# Patient Record
Sex: Female | Born: 1960 | Race: Black or African American | Hispanic: No | Marital: Single
Health system: Midwestern US, Community
[De-identification: ages and names within clinical notes are randomized; demographics above are authoritative.]

## PROBLEM LIST (undated history)

## (undated) DIAGNOSIS — J441 Chronic obstructive pulmonary disease with (acute) exacerbation: Principal | ICD-10-CM

## (undated) DIAGNOSIS — F319 Bipolar disorder, unspecified: Secondary | ICD-10-CM

## (undated) DIAGNOSIS — J45909 Unspecified asthma, uncomplicated: Secondary | ICD-10-CM

## (undated) DIAGNOSIS — I509 Heart failure, unspecified: Secondary | ICD-10-CM

## (undated) DIAGNOSIS — B20 Human immunodeficiency virus [HIV] disease: Secondary | ICD-10-CM

## (undated) DIAGNOSIS — G629 Polyneuropathy, unspecified: Secondary | ICD-10-CM

## (undated) DIAGNOSIS — J449 Chronic obstructive pulmonary disease, unspecified: Secondary | ICD-10-CM

## (undated) DIAGNOSIS — B029 Zoster without complications: Secondary | ICD-10-CM

## (undated) DIAGNOSIS — F209 Schizophrenia, unspecified: Secondary | ICD-10-CM

## (undated) HISTORY — PX: BRONCHOSCOPY: SUR163

## (undated) HISTORY — PX: ABDOMINAL HYSTERECTOMY: SHX81

## (undated) HISTORY — PX: BUNIONECTOMY: SHX129

---

## 2015-05-14 ENCOUNTER — Inpatient Hospital Stay
Admit: 2015-05-14 | Discharge: 2015-05-14 | Disposition: A | Discharge status: Home / Self Care | Payer: MEDICARE | Attending: Pediatric Emergency Medicine

## 2015-05-14 DIAGNOSIS — J4521 Mild intermittent asthma with (acute) exacerbation: Secondary | ICD-10-CM

## 2015-05-14 MED ORDER — ALBUTEROL SULFATE HFA 90 MCG/ACTUATION AEROSOL INHALER
90 mcg/actuation | RESPIRATORY_TRACT | Status: AC
Start: 2015-05-14 — End: 2015-05-14
  Administered 2015-05-14: via RESPIRATORY_TRACT

## 2015-05-14 MED ORDER — ALBUTEROL SULFATE 0.083 % (0.83 MG/ML) SOLN FOR INHALATION
2.5 mg /3 mL (0.083 %) | RESPIRATORY_TRACT | Status: AC
Start: 2015-05-14 — End: 2015-05-14
  Administered 2015-05-14: 23:00:00 via RESPIRATORY_TRACT

## 2015-05-14 MED ORDER — DEXAMETHASONE SODIUM PHOSPHATE 10 MG/ML IJ SOLN
10 mg/mL | INTRAMUSCULAR | Status: AC
Start: 2015-05-14 — End: 2015-05-14
  Administered 2015-05-14: 21:00:00 via INTRAMUSCULAR

## 2015-05-14 MED ORDER — ALBUTEROL SULFATE 0.083 % (0.83 MG/ML) SOLN FOR INHALATION
2.5 mg /3 mL (0.083 %) | RESPIRATORY_TRACT | Status: AC
Start: 2015-05-14 — End: 2015-05-14
  Administered 2015-05-14: 21:00:00 via RESPIRATORY_TRACT

## 2015-05-14 MED ORDER — IPRATROPIUM BROMIDE 0.02 % SOLN FOR INHALATION
0.02 % | RESPIRATORY_TRACT | Status: AC
Start: 2015-05-14 — End: 2015-05-14
  Administered 2015-05-14: 21:00:00 via RESPIRATORY_TRACT

## 2015-05-14 MED ORDER — PREDNISONE 20 MG TAB
20 mg | ORAL_TABLET | Freq: Every day | ORAL | Status: AC
Start: 2015-05-14 — End: 2015-05-18

## 2015-05-14 MED FILL — VENTOLIN HFA 90 MCG/ACTUATION AEROSOL INHALER: 90 mcg/actuation | RESPIRATORY_TRACT | Qty: 8

## 2015-05-14 MED FILL — ALBUTEROL SULFATE 0.083 % (0.83 MG/ML) SOLN FOR INHALATION: 2.5 mg /3 mL (0.083 %) | RESPIRATORY_TRACT | Qty: 3

## 2015-05-14 MED FILL — DEXAMETHASONE SODIUM PHOSPHATE 10 MG/ML IJ SOLN: 10 mg/mL | INTRAMUSCULAR | Qty: 1

## 2015-05-14 MED FILL — IPRATROPIUM BROMIDE 0.02 % SOLN FOR INHALATION: 0.02 % | RESPIRATORY_TRACT | Qty: 2.5

## 2015-05-14 MED FILL — ALBUTEROL SULFATE 0.083 % (0.83 MG/ML) SOLN FOR INHALATION: 2.5 mg /3 mL (0.083 %) | RESPIRATORY_TRACT | Qty: 2

## 2015-05-14 NOTE — ED Notes (Signed)
6:11 PM   Pt signed out to me by Dr. Manson Passey awaiting reeval after neb and decadron.   Anticipate d/c.     6:42 PM  Pt still wheezing after neb x 1 and decadron.   Will provide another round of neb treatment and reeval.     7:38 PM  Pt reevaluated after 2nd neb.   Lungs CTA. Pt ready to go home.     6:23 PM  Documented by Alwyn Pea, acting as a scribe for Dr. Elesa Hacker, MD.     PROVIDER ATTESTATION:  7:39 PM    The entirety of this note, signed by me, accurately reflects all works, treatments, procedures, and medical decision making performed by me, Johnna Acosta, MD.

## 2015-05-14 NOTE — ED Notes (Signed)
Report to sheila . Pt admits to feeling much better. Neb tx completed. Food to pt.

## 2015-05-14 NOTE — ED Notes (Signed)
Pt discharged home with instruction in hand pt left ambulating steady and signed that she understood the instructions giving
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
## 2015-05-14 NOTE — ED Notes (Signed)
Breath sounds improving. Wheeze present rt side dr. Manson PasseyBrown made aware. Additional neb treatment to be ordered.
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
## 2016-05-04 ENCOUNTER — Inpatient Hospital Stay
Admit: 2016-05-04 | Discharge: 2016-05-04 | Disposition: A | Discharge status: Home / Self Care | Payer: MEDICARE | Attending: Emergency Medicine

## 2016-05-04 ENCOUNTER — Emergency Department: Admit: 2016-05-04 | Payer: MEDICARE | Primary: Family Medicine

## 2016-05-04 ENCOUNTER — Observation Stay

## 2016-05-04 DIAGNOSIS — J441 Chronic obstructive pulmonary disease with (acute) exacerbation: Secondary | ICD-10-CM

## 2016-05-04 LAB — EKG 12-LEAD
Atrial Rate: 93 {beats}/min
P Axis: 75 degrees
P-R Interval: 136 ms
Q-T Interval: 388 ms
QRS Duration: 78 ms
QTc Calculation (Bazett): 482 ms
R Axis: 23 degrees
T Axis: 63 degrees
Ventricular Rate: 93 {beats}/min

## 2016-05-04 LAB — METABOLIC PANEL, BASIC
Anion gap: 15 mmol/L (ref 10–17)
BUN/Creatinine ratio: 9 (ref 6.0–20.0)
BUN: 8 MG/DL (ref 7–18)
CO2: 26 mmol/L (ref 21–32)
Calcium: 8.9 MG/DL (ref 8.5–10.1)
Chloride: 105 mmol/L (ref 98–107)
Creatinine: 0.85 MG/DL (ref 0.6–1.3)
GFR est AA: >60 mL/min/{1.73_m2} (ref >60)
GFR est non-AA: >60 mL/min/{1.73_m2} (ref >60)
Glucose: 117 mg/dL — ABNORMAL HIGH (ref 74–106)
Potassium: 3.8 mmol/L (ref 3.50–5.10)
Sodium: 142 mmol/L (ref 136–145)

## 2016-05-04 LAB — CBC WITH AUTOMATED DIFF
ABS. BASOPHILS: 0 10*3/uL (ref 0.0–0.2)
ABS. EOSINOPHILS: 0.6 10*3/uL (ref 0.0–0.7)
ABS. LYMPHOCYTES: 2.6 10*3/uL (ref 1.2–3.4)
ABS. MONOCYTES: 0.6 10*3/uL — ABNORMAL LOW (ref 1.1–3.2)
ABS. NEUTROPHILS: 2.9 10*3/uL (ref 1.4–6.5)
BASOPHILS: 0 % (ref 0–2)
EOSINOPHILS: 9 % — ABNORMAL HIGH (ref 0–5)
HCT: 34.8 % (ref 34.0–42.2)
HGB: 10.9 g/dL — ABNORMAL LOW (ref 11.8–14.2)
IMMATURE GRANULOCYTES: 0.4 % (ref 0.0–5.0)
LYMPHOCYTES: 39 % (ref 16–40)
MCH: 24.3 PG — ABNORMAL LOW (ref 27–31)
MCHC: 31.3 g/dL — ABNORMAL LOW (ref 32–36)
MCV: 77.7 FL — ABNORMAL LOW (ref 81–99)
MONOCYTES: 8 % (ref 0–12)
MPV: 10 FL (ref 7.4–10.4)
NEUTROPHILS: 44 % (ref 40–70)
PLATELET: 243 10*3/uL (ref 140–450)
RBC: 4.48 M/uL (ref 4.0–5.2)
RDW: 13.6 % (ref 11.5–14.5)
WBC: 6.8 10*3/uL (ref 4.8–10.8)

## 2016-05-04 LAB — EKG, 12 LEAD, INITIAL
Atrial Rate: 93 {beats}/min
Calculated P Axis: 75 degrees
Calculated R Axis: 23 degrees
Calculated T Axis: 63 degrees
P-R Interval: 136 ms
Q-T Interval: 388 ms
QRS Duration: 78 ms
QTC Calculation (Bezet): 482 ms
Ventricular Rate: 93 {beats}/min

## 2016-05-04 LAB — BNP: BNP: 36 pg/mL (ref 5–100)

## 2016-05-04 LAB — TROPONIN I: Troponin-I, Qt.: <0.02 ng/mL (ref 0.00–0.08)

## 2016-05-04 MED ORDER — SENNOSIDES 8.6 MG TAB
8.6 mg | Freq: Every day | ORAL | Status: DC
Start: 2016-05-04 — End: 2016-05-05
  Administered 2016-05-05: 14:00:00 via ORAL

## 2016-05-04 MED ORDER — EMTRICITABINE-TENOFOVIR 200 MG-300 MG TAB
200-300 mg | Freq: Every day | ORAL | Status: DC
Start: 2016-05-04 — End: 2016-05-05
  Administered 2016-05-04 – 2016-05-05 (×2): via ORAL

## 2016-05-04 MED ORDER — PREGABALIN 50 MG CAP
50 mg | Freq: Two times a day (BID) | ORAL | Status: DC
Start: 2016-05-04 — End: 2016-05-05
  Administered 2016-05-04 – 2016-05-05 (×3): via ORAL

## 2016-05-04 MED ORDER — ALBUTEROL SULFATE 0.083 % (0.83 MG/ML) SOLN FOR INHALATION
2.5 mg /3 mL (0.083 %) | Freq: Two times a day (BID) | RESPIRATORY_TRACT | Status: DC
Start: 2016-05-04 — End: 2016-05-05
  Administered 2016-05-04 (×2): via RESPIRATORY_TRACT

## 2016-05-04 MED ORDER — ALBUTEROL SULFATE 0.083 % (0.83 MG/ML) SOLN FOR INHALATION
2.5 mg /3 mL (0.083 %) | RESPIRATORY_TRACT | Status: DC | PRN
Start: 2016-05-04 — End: 2016-05-05
  Administered 2016-05-04 – 2016-05-05 (×4): via RESPIRATORY_TRACT

## 2016-05-04 MED ORDER — TRAMADOL 50 MG TAB
50 mg | Freq: Three times a day (TID) | ORAL | Status: DC
Start: 2016-05-04 — End: 2016-05-05
  Administered 2016-05-04 – 2016-05-05 (×4): via ORAL

## 2016-05-04 MED ORDER — IPRATROPIUM BROMIDE 0.02 % SOLN FOR INHALATION
0.02 % | RESPIRATORY_TRACT | Status: AC
Start: 2016-05-04 — End: 2016-05-04
  Administered 2016-05-04: 07:00:00 via RESPIRATORY_TRACT

## 2016-05-04 MED ORDER — SODIUM CHLORIDE 0.9 % IV
INTRAVENOUS | Status: DC
Start: 2016-05-04 — End: 2016-05-04

## 2016-05-04 MED ORDER — MAGNESIUM SULFATE 2 GRAM/50 ML IVPB
2 gram/50 mL (4 %) | Freq: Once | INTRAVENOUS | Status: AC
Start: 2016-05-04 — End: 2016-05-04
  Administered 2016-05-04: 06:00:00 via INTRAVENOUS

## 2016-05-04 MED ORDER — PANTOPRAZOLE 40 MG TAB, DELAYED RELEASE
40 mg | Freq: Every day | ORAL | Status: DC
Start: 2016-05-04 — End: 2016-05-05
  Administered 2016-05-04 – 2016-05-05 (×2): via ORAL

## 2016-05-04 MED ORDER — IPRATROPIUM BROMIDE 0.02 % SOLN FOR INHALATION
0.02 % | RESPIRATORY_TRACT | Status: AC
Start: 2016-05-04 — End: 2016-05-04
  Administered 2016-05-04: 06:00:00 via RESPIRATORY_TRACT

## 2016-05-04 MED ORDER — IPRATROPIUM BROMIDE 0.02 % SOLN FOR INHALATION
0.02 % | RESPIRATORY_TRACT | Status: AC
Start: 2016-05-04 — End: 2016-05-04

## 2016-05-04 MED ORDER — PREDNISONE 20 MG TAB
20 mg | ORAL | Status: DC
Start: 2016-05-04 — End: 2016-05-04

## 2016-05-04 MED ORDER — OTHER(NON-FORMULARY)
Freq: Every day | Status: DC
Start: 2016-05-04 — End: 2016-05-04

## 2016-05-04 MED ORDER — AZITHROMYCIN 250 MG TAB
250 mg | Freq: Every day | ORAL | Status: DC
Start: 2016-05-04 — End: 2016-05-05
  Administered 2016-05-04 – 2016-05-05 (×2): via ORAL

## 2016-05-04 MED ORDER — SODIUM CHLORIDE 0.9 % IJ SYRG
INTRAMUSCULAR | Status: DC | PRN
Start: 2016-05-04 — End: 2016-05-04

## 2016-05-04 MED ORDER — GUAIFENESIN 100 MG/5 ML ORAL LIQUID
100 mg/5 mL | Freq: Four times a day (QID) | ORAL | Status: DC | PRN
Start: 2016-05-04 — End: 2016-05-05

## 2016-05-04 MED ORDER — ALBUTEROL SULFATE 0.083 % (0.83 MG/ML) SOLN FOR INHALATION
2.5 mg /3 mL (0.083 %) | RESPIRATORY_TRACT | Status: AC
Start: 2016-05-04 — End: 2016-05-04

## 2016-05-04 MED ORDER — LISINOPRIL 5 MG TAB
5 mg | Freq: Every day | ORAL | Status: DC
Start: 2016-05-04 — End: 2016-05-05
  Administered 2016-05-04 – 2016-05-05 (×2): via ORAL

## 2016-05-04 MED ORDER — NALOXONE 0.4 MG/ML INJECTION
0.4 mg/mL | INTRAMUSCULAR | Status: DC | PRN
Start: 2016-05-04 — End: 2016-05-05

## 2016-05-04 MED ORDER — ACETAMINOPHEN 325 MG TABLET
325 mg | Freq: Four times a day (QID) | ORAL | Status: DC | PRN
Start: 2016-05-04 — End: 2016-05-05

## 2016-05-04 MED ORDER — NICOTINE 7 MG/24 HR DAILY PATCH
7 mg/24 hr | TRANSDERMAL | Status: DC
Start: 2016-05-04 — End: 2016-05-05

## 2016-05-04 MED ORDER — ENOXAPARIN 40 MG/0.4 ML SUB-Q SYRINGE
40 mg/0.4 mL | SUBCUTANEOUS | Status: DC
Start: 2016-05-04 — End: 2016-05-05
  Administered 2016-05-04 – 2016-05-05 (×2): via SUBCUTANEOUS

## 2016-05-04 MED ORDER — ALBUTEROL SULFATE 0.083 % (0.83 MG/ML) SOLN FOR INHALATION
2.5 mg /3 mL (0.083 %) | RESPIRATORY_TRACT | Status: DC
Start: 2016-05-04 — End: 2016-05-04

## 2016-05-04 MED ORDER — SODIUM CHLORIDE 0.9 % IJ SYRG
INTRAMUSCULAR | Status: DC | PRN
Start: 2016-05-04 — End: 2016-05-05

## 2016-05-04 MED ORDER — METHYLPREDNISOLONE (PF) 40 MG/ML IJ SOLR
40 mg/mL | Freq: Four times a day (QID) | INTRAMUSCULAR | Status: DC
Start: 2016-05-04 — End: 2016-05-05
  Administered 2016-05-04 – 2016-05-05 (×6): via INTRAVENOUS

## 2016-05-04 MED ORDER — ALBUTEROL SULFATE 0.083 % (0.83 MG/ML) SOLN FOR INHALATION
2.5 mg /3 mL (0.083 %) | RESPIRATORY_TRACT | Status: AC
Start: 2016-05-04 — End: 2016-05-04
  Administered 2016-05-04: 07:00:00 via RESPIRATORY_TRACT

## 2016-05-04 MED ORDER — ALBUTEROL SULFATE 0.083 % (0.83 MG/ML) SOLN FOR INHALATION
2.5 mg /3 mL (0.083 %) | RESPIRATORY_TRACT | Status: AC
Start: 2016-05-04 — End: 2016-05-04
  Administered 2016-05-04: 06:00:00 via RESPIRATORY_TRACT

## 2016-05-04 MED ORDER — DIPHENHYDRAMINE 25 MG CAP
25 mg | Freq: Four times a day (QID) | ORAL | Status: DC | PRN
Start: 2016-05-04 — End: 2016-05-05

## 2016-05-04 MED ORDER — SODIUM CHLORIDE 0.9 % IJ SYRG
Freq: Three times a day (TID) | INTRAMUSCULAR | Status: DC
Start: 2016-05-04 — End: 2016-05-05
  Administered 2016-05-04 – 2016-05-05 (×5): via INTRAVENOUS

## 2016-05-04 MED ORDER — MONTELUKAST 10 MG TAB
10 mg | Freq: Every day | ORAL | Status: DC
Start: 2016-05-04 — End: 2016-05-05
  Administered 2016-05-04 – 2016-05-05 (×2): via ORAL

## 2016-05-04 MED ORDER — SODIUM CHLORIDE 0.9 % IJ SYRG
Freq: Three times a day (TID) | INTRAMUSCULAR | Status: DC
Start: 2016-05-04 — End: 2016-05-04

## 2016-05-04 MED ORDER — BUDESONIDE 0.25 MG/2 ML NEB SUSPENSION
0.25 mg/2 mL | Freq: Two times a day (BID) | RESPIRATORY_TRACT | Status: DC
Start: 2016-05-04 — End: 2016-05-05
  Administered 2016-05-04 – 2016-05-05 (×2): via RESPIRATORY_TRACT

## 2016-05-04 MED FILL — ALBUTEROL SULFATE 0.083 % (0.83 MG/ML) SOLN FOR INHALATION: 2.5 mg /3 mL (0.083 %) | RESPIRATORY_TRACT | Qty: 1

## 2016-05-04 MED FILL — MAGNESIUM SULFATE 2 GRAM/50 ML IVPB: 2 gram/50 mL (4 %) | INTRAVENOUS | Qty: 50

## 2016-05-04 MED FILL — SOLU-MEDROL (PF) 40 MG/ML SOLUTION FOR INJECTION: 40 mg/mL | INTRAMUSCULAR | Qty: 1

## 2016-05-04 MED FILL — MONTELUKAST 10 MG TAB: 10 mg | ORAL | Qty: 1

## 2016-05-04 MED FILL — PANTOPRAZOLE 40 MG TAB, DELAYED RELEASE: 40 mg | ORAL | Qty: 1

## 2016-05-04 MED FILL — ALBUTEROL SULFATE 0.083 % (0.83 MG/ML) SOLN FOR INHALATION: 2.5 mg /3 mL (0.083 %) | RESPIRATORY_TRACT | Qty: 3

## 2016-05-04 MED FILL — LISINOPRIL 5 MG TAB: 5 mg | ORAL | Qty: 1

## 2016-05-04 MED FILL — SODIUM CHLORIDE 0.9 % IJ SYRG: INTRAMUSCULAR | Qty: 20

## 2016-05-04 MED FILL — SODIUM CHLORIDE 0.9 % IV: INTRAVENOUS | Qty: 1000

## 2016-05-04 MED FILL — TRUVADA 200 MG-300 MG TABLET: 200-300 mg | ORAL | Qty: 1

## 2016-05-04 MED FILL — ALBUTEROL SULFATE 0.083 % (0.83 MG/ML) SOLN FOR INHALATION: 2.5 mg /3 mL (0.083 %) | RESPIRATORY_TRACT | Qty: 6

## 2016-05-04 MED FILL — TRAMADOL 50 MG TAB: 50 mg | ORAL | Qty: 1

## 2016-05-04 MED FILL — LYRICA 50 MG CAPSULE: 50 mg | ORAL | Qty: 1

## 2016-05-04 MED FILL — IPRATROPIUM BROMIDE 0.02 % SOLN FOR INHALATION: 0.02 % | RESPIRATORY_TRACT | Qty: 2.5

## 2016-05-04 MED FILL — NICOTINE 7 MG/24 HR DAILY PATCH: 7 mg/24 hr | TRANSDERMAL | Qty: 1

## 2016-05-04 MED FILL — AZITHROMYCIN 250 MG TAB: 250 mg | ORAL | Qty: 2

## 2016-05-04 MED FILL — SENNA LAX 8.6 MG TABLET: 8.6 mg | ORAL | Qty: 1

## 2016-05-04 MED FILL — BUDESONIDE 0.25 MG/2 ML NEB SUSPENSION: 0.25 mg/2 mL | RESPIRATORY_TRACT | Qty: 1

## 2016-05-04 MED FILL — ENOXAPARIN 40 MG/0.4 ML SUB-Q SYRINGE: 40 mg/0.4 mL | SUBCUTANEOUS | Qty: 0.4

## 2016-05-04 NOTE — Progress Notes (Signed)
Readmission Risk Assessment     Name: Sarah Anderson    Date:05/04/2016   Room #:       Column 1 Column 2 Column 3    ???Do/Have/Are you??? Low Risk Moderate Risk High Risk       Row 1   Living Situation have a live-in caregiver to assist you  OR  live alone with community support     Lives with boyfriend                                 live alone with limited community support                                    live alone with no community support  OR  have live-in family who aren't actively involved in your care                                      Row 2 Physician have a  Primary Care  doctor  Dr Azzie Almas [phone]                                   N/A                                   NOT have a Primary Care doctor (4pts)                                     Row 3 Insurance insured                                   N/A                                   uninsured (4pts)                                     Row 4 Activities of Daily Living (ADL/IADL) (ADls): groom, feed, dress, and get to the bathroom independently  (IADls): shop, budget, keep house, travel, manage medications independently                                      require temporary or regular assistance with ADls or IADls                                      require considerable assistance with ADls or IADls                                      Row  5 Disease Mgmt feel capable to manage your chronic illness and follow  your  doctor's orders independently                                    N/A                                   require considerable assistance managing your chronic illness and other medical needs (clinically complex)                                     Row 6 Meds Mgmt feel capable to manage your medications independently                                   require some assistance managing your medication                                   require considerable assistance managing your medications                                      Row 7 Polypharmacy N/A              take> 7 different medications at home              N/A              Row 8 Cognitive Ability feel capable to make your own  health care decisions                                   sometimes have difficulty making your own health care decisions                                   feel incapable to make your own health care decisions (cognitive impairment)                                     Row 9 CHF N/A                                   N/A                                   have Congestive Heart Failure (4pts)                                      Row 10 COPD N/A  N/A                                   have COPD (4pts)                                     Row 11 Diabetes N/A                                   N/A                                   have Diabetes (4pts)                                      Row 12 HIV/AIDS N/A                                   N/A                                   have HIVor AIDS (4pts)                                     Row 13 Fall Risk N/A                                   N/A                                   have a history of falls (4pts)                                     Row 14 Wound Care N/A                                   N/A                                   wounds or pressure ulcers (4pts)                                     Row 15 Alcohol N/A                                   N/A  had> 4 alcoholic drinks/day during last 12 months (4pts)                                      Row 16 Drugs N/A              N/A              used marijuana, cocaine, heroin, or anything else to get "high" during last 12 months (4pts)              Row 17 Mental Health N/A                                   have a history of mental illness                                   have a history of mental illness (4pts)                                      Row 18 Readmission? N/A                                   N/A                                   been hospitalized in the last 30 days (4pts)                                      Row 6319 ACO/MSSP N/A                                   N/A                                   enrolled in the Medicare Shared Savings Program(4pts)                                      Totals:    7  (Low Risk)                                          1  (Moderate Risk)                                          24  (?4 =High Risk)                          Assessment completed by: Knox RoyaltyWendian C Noel-Rose, 05/04/2016

## 2016-05-04 NOTE — Progress Notes (Signed)
Admitted this AM by Dr. Hyacinth MeekerMiller for acute COPD exacerbation.  Patient seen and examined.  H&P, labs, meds, V/S, imagings  Continue nebs, steroids,   Will add pulmicort  Oxygen supplementation  Resume HAART  Continue BP meds  Monitor V/S    Sarah CharonSolomon A Ryosuke Ericksen, MD

## 2016-05-04 NOTE — Progress Notes (Signed)
Moon Letter was sign and given to the patient.

## 2016-05-04 NOTE — Other (Signed)
Bedside and Verbal shift change report given to Jasmine (oncoming nurse) by cmiller (offgoing nurse). Report included the following information SBAR, Kardex, MAR and Recent Results.

## 2016-05-04 NOTE — Progress Notes (Signed)
Spiritual Care Assessment/Progress Notes    Sarah Anderson [id number]  [id number]    [birth year]  55 y.o.  female    Patient Telephone Number: [phone] (home)   Religious Affiliation: Ephri[employer] Knuckleshristian   Language: English   Extended Emergency Contact Information  Primary Emergency Contact: Sarah Anderson   [place]  Home Phone: [phone]  Relation: Boyfriend   Patient Active Problem List    Diagnosis Date Noted   ??? COPD exacerbation (HCC) 05/04/2016   ??? Acute exacerbation of COPD with asthma (HCC) 05/04/2016     Class: Acute   ??? Essential hypertension 05/04/2016     Class: Chronic   ??? HIV (human immunodeficiency virus infection) (HCC) 05/04/2016     Class: Chronic   ??? Tobacco use disorder 05/04/2016     Class: Chronic        Date: 05/04/2016       Level of Religious/Spiritual Activity:           Involved in faith tradition/spiritual practice             Not involved in faith tradition/spiritual practice           Spiritually oriented             Claims no spiritual orientation             seeking spiritual identity           Feels alienated from religious practice/tradition           Feels angry about religious practice/tradition           Spirituality/religious tradition is a Theatre stage managerresource for coping at this time.           Not able to assess due to medical condition    Services Provided Today:           crisis intervention             reading Scriptures           spiritual assessment             prayer           empathic listening/emotional support           rites and rituals (cite in comments)           life review              religious support           theological development            advocacy           ethical dialog              blessing           bereavement support             support to family           anticipatory grief support            help with AMD           spiritual guidance             meditation      Spiritual Care Needs           Emotional Support           Spiritual/Religious Care            Loss/Adjustment           Advocacy/Referral                /  Ethics           No needs expressed at               this time           Other: (note in               comments)  Spiritual Care Plan           Follow up visits with               pt/family           Provide materials           Schedule sacraments           Contact Community               Clergy           Follow up as needed           Other: (note in               comments)     Comments: (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                    (I) Her faith in Lorie Apley is what gets her through life.                    (C) She don't attend a faith base community on a regular bases but she do attend when she can.                    (A) Patient wanted a bible which was given to her. Very thankful for prayer and pastoral care visit.    Kathi Simpers

## 2016-05-04 NOTE — ED Provider Notes (Signed)
HPI Comments: Sarah Anderson is a 55 year old female w/ hx of COPD, asthma, CHF who presents w/ report of uncontrolled SOB over the last day. She has has worsened SOB from baseline for the past 2 weeks. She was seen 4 days ago at an OSH for same and prescribed steroids. She reports compliance with this as well as her home combivent, symbicort, proair and nebs. She denies fever, she reports nonproductive cough, wheezing. She does not use home oxygen. She has not had leg swelling or recent weight gain.    The history is provided by the patient.        Past Medical History:   Diagnosis Date   ??? Asthma    ??? Chronic obstructive pulmonary disease (HCC)    ??? Infectious disease     hiv +   ??? Psychiatric disorder     bipolar        History reviewed. No pertinent surgical history.      History reviewed. No pertinent family history.    Social History     Social History   ??? Marital status: SINGLE     Spouse name: N/A   ??? Number of children: N/A   ??? Years of education: N/A     Occupational History   ??? Not on file.     Social History Main Topics   ??? Smoking status: Former Smoker     Packs/day: 0.00   ??? Smokeless tobacco: Never Used   ??? Alcohol use No   ??? Drug use: No   ??? Sexual activity: No     Other Topics Concern   ??? Not on file     Social History Narrative         ALLERGIES: Fruit extracts; Nuts [tree nut]; Pcn [penicillins]; Shellfish derived; and Tuna oil    Review of Systems   Constitutional: Negative.  Negative for chills and fever.   HENT: Negative.  Negative for congestion and sore throat.    Eyes: Negative.    Respiratory: Positive for cough, shortness of breath and wheezing. Negative for chest tightness.    Cardiovascular: Negative for chest pain.   Gastrointestinal: Positive for diarrhea.   Endocrine: Negative.    Genitourinary: Negative.    Musculoskeletal: Negative.    Skin: Negative.  Negative for rash.   Allergic/Immunologic: Negative.    Neurological: Negative.    Hematological: Negative.     All other systems reviewed and are negative.      Vitals:    05/04/16 0134 05/04/16 0150   BP: 147/75    Pulse: (!) 102    Resp: 20    Temp: 98 ??F (36.7 ??C)    SpO2: 99% 98%   Weight: 102.1 kg (225 lb)    Height:  (1.651 m)             Physical Exam   Constitutional: She is oriented to person, place, and time. She appears well-developed and well-nourished.   HENT:   Head: Normocephalic and atraumatic.   Right Ear: External ear normal.   Left Ear: External ear normal.   Nose: Nose normal.   Mouth/Throat: Oropharynx is clear and moist.   Eyes: Conjunctivae and EOM are normal. Pupils are equal, round, and reactive to light.   Cardiovascular: Normal rate, regular rhythm, normal heart sounds and intact distal pulses.    Pulmonary/Chest: She is in respiratory distress. She has wheezes. She has no rales. She exhibits no tenderness.   Speaks in complete  sentences but with accessory muscle use and belly breathing; diffuse expiratory wheezes; decreased air movement   Abdominal: Soft. Bowel sounds are normal. She exhibits no distension and no mass. There is no tenderness.   Musculoskeletal: Normal range of motion. She exhibits no edema or tenderness.   Neurological: She is alert and oriented to person, place, and time.   Skin: Skin is warm and dry.   Nursing note and vitals reviewed.       MDM  Number of Diagnoses or Management Options  Diagnosis management comments: 55 year old female w/ respiratory distress, wheezing; concern for copd/asthma exac; received decadron, duoneb, and albuterol on EMS  Will check peak flow, repeat duoneb, give mag, CXR, reasess    ED Course   CXR 2:34 AM    Viewed by me     Interpreted by me   No acute disease    Abnormal : see below    Discussed with       Radiologist      Normal mediastinum  Hyperinflated   Cardiomegaly   Interpretation:   Portable; b/l interstitial edema; no infiltrates  bnp and tropinin added on to labs   EKG 2:48 AM   Viewed by me  Interpreted by me  Abnormal as below:     Normal Sinus Rhythm   Normal rate    Normal Axis   Normal ST/T waves   Normal QRS   Normal Intervals   Tachycardic @ rate of:            bpm   Bradycardic @ rate of:             bpm   Nonspecific T-wave inversions   Nonspecific ST changes    LVH     RBBB  LBBB  Paced   Interpretation: sinus at approx 95, normal axis; q waves  v1 and v2; no comparison  5:14 AM  Late entry; patient remained with with peak flow 150 before and after treatment; has received two sets of 3 to 1 albutera/atrovent and still w/ expiratory wheezes; respiratory distress has improved though; no desats  Have discussed need for admission w/ Dr. Hyacinth MeekerMiller  Procedures

## 2016-05-04 NOTE — Progress Notes (Signed)
Spoke with the patient at her bedside.  The [facility] located at [address], [place], [place] [id number]  [phone]).

## 2016-05-04 NOTE — Other (Signed)
Bedside and Verbal shift change report given to Arline Aspindy (Cabin crewoncoming nurse) by Leavy CellaJasmine (offgoing nurse). Report included the following information SBAR, Kardex, ED Summary, OR Summary, Procedure Summary, Intake/Output, Recent Results and Med Rec Status.

## 2016-05-04 NOTE — Progress Notes (Signed)
Problem: Chronic Obstructive Pulmonary Disease (COPD)  Goal: *Absence of hypoxia  Outcome: Progressing Towards Goal  The patient denies chest pain, cough, or rale. Sarah Anderson gets neb treatment as scheduled; tolerated well .   O2 Sat (%): 97 %,   O2 Device: Nasal cannula,   Breath Sounds Bilateral: Rhonchi,  Pulse: 94

## 2016-05-04 NOTE — Progress Notes (Signed)
Care Management Interventions  PCP Verified by CM: Yes  Palliative Care Consult (Criteria: CHF and RRAT>21): No  Mode of Transport at Discharge: Self  Transition of Care Consult (CM Consult): Discharge Planning  Discharge Durable Medical Equipment: No  Physical Therapy Consult: Yes  Occupational Therapy Consult: No  Speech Therapy Consult: No  Current Support Network: Lives with Spouse  Confirm Follow Up Transport: Cab  Plan discussed with Pt/Family/Caregiver: Yes  Freedom of Choice Offered: Yes  Discharge Location  Discharge Placement: Home    Pt is a 55 year old AA female admitted for COPD. Pt is alert and oriented, she verified information on demographic sheet. Pt lives with her boyfriend, she was fairly independent with ADLs prior to admission, however she does walk with a cane. PT consult pending. Pt verified her PCP as Dr Genella MechJames Tasinda and she uses [facility], denies any difficulty filling Rxs. She has [employer] and will take a cab home at time of discharge. Pt does have a nebulizer at home, but no home oxygen. Discussed home health and pt is in agreement, she signed Freedom of Choice. Will continue to follow pt for discharge planning.

## 2016-05-04 NOTE — ED Notes (Signed)
TRANSFER - OUT REPORT:    Verbal report given to Cindy(name) on Weston SettleHannah L Funches  being transferred to 342-1(unit) for routine progression of care       Report consisted of patient???s Situation, Background, Assessment and   Recommendations(SBAR).     Information from the following report(s) SBAR, ED Summary and MAR was reviewed with the receiving nurse.    Lines:   Peripheral IV 05/04/16 Left Wrist (Active)   Site Assessment Clean, dry, & intact 05/04/2016  2:05 AM   Phlebitis Assessment 0 05/04/2016  2:05 AM   Infiltration Assessment 0 05/04/2016  2:05 AM   Dressing Status Clean, dry, & intact 05/04/2016  2:05 AM   Dressing Type Transparent 05/04/2016  2:05 AM   Alcohol Cap Used Yes 05/04/2016  2:05 AM        Opportunity for questions and clarification was provided.      Patient transported with:   O2 @ 2 liters  Tech

## 2016-05-04 NOTE — ED Triage Notes (Signed)
C/o SOB with dry cough. Just finished 5-day course prednisone, has I/E wheezing. Received 1 duoneb and 1 albuterol by EMS., received 10mg  decadron IV.

## 2016-05-04 NOTE — Other (Signed)
05/04/2016      GLOS     Discharge Date 2 days    RRAT score     Plan of care O2 2l/m, CXR shows no active disease. IVF, nebs, steroids, PO zithromax          Sheral FlowVictoria Howell [facility] City Medical CenterBSN,RN

## 2016-05-04 NOTE — H&P (Signed)
[facility]  ADMISSION NOTE/H&P  Demetrius Charity      Name:  RAYVIN, ABID  MR#:  [id number]  DOB:  11-Jun-1961  Account #:  [id number]  Date of Adm:  05/04/2016      DATE OF ADMISSION: 05/04/2016    ATTENDING PHYSICIAN: Milus Banister, MD    PRIMARY CARE PHYSICIAN: Tia Alert, MD    CHIEF COMPLAINT: Shortness of breath.    HISTORY OF PRESENT ILLNESS: A 55 year old, African  American female with history of COPD, asthma, never been  intubated, HIV infection, bipolar disorder, presented to [facility] for evaluation of  shortness of breath associated with worsening wheezing.  The patient was seen in [facility] last week, given  nebulizer treatments and started on p.o. steroids. The patient  finished the steroids yesterday, but she continues to have  shortness of breath and wheezing. The patient received  multiple nebulizer treatments in the emergency room and IV  steroids without improvement of symptoms. The patient  continues to wheeze and for that reason she is being  admitted for further management.    PAST MEDICAL HISTORY  1. COPD with asthma.  2. HIV infection.  3. Bipolar disorder.  4. Hypertension.    PAST SURGICAL HISTORY: Negative.    PERSONAL AND SOCIAL HISTORY: Smokes 1/4 pack per  day for 20 years. Denies any alcohol or illicit drug use.    FAMILY HISTORY: Mother and father have no known  medical problems.    ALLERGIES: PENICILLIN, SWELLING.    FOOD ALLERGIES  1. FRUIT EXTRACT.  2. NUTS.  3. CONTRAST.  4. SEAFOOD, CAUSING ITCHING.    MEDICATIONS  1. Lisinopril 5 mg daily.  2. Singulair 5 mg daily.  3. Tramadol 50 t.i.d. for pain.  4. Combivent inhaler.  5. Symbicort.    REVIEW OF SYSTEMS  CONSTITUTIONAL: The patient denies any fever or chills.  Complains of feeling fatigued.  HENT: Denies any headache. No dizziness. No sore throat.  No tinnitus. No rhinorrhea.  EYES: Denies any blurry vision or double vision. No eye  pain. Denies any eye discharge.   CARDIAC: Denies any chest pain. No palpitations. No PND.  No orthopnea.  RESPIRATORY: Positive for shortness of breath and  wheezing, cough. Denies hemoptysis.  GASTROINTESTINAL: Complains of intermittent diarrhea.  Denies any abdominal pain. No nausea. No vomiting. Denies  hematochezia, hematemesis, and melena.  GENITOURINARY: No dysuria or hematuria. Denies urinary  incontinence, urinary retention.  MUSCULOSKELETAL: Denies any back pains and joint  pains.  NEUROLOGIC: The patient denies any seizure, syncope,  numbness.  All other systems reviewed and are negative.    PHYSICAL EXAMINATION  VITAL SIGNS: Blood pressure 147/75, heart rate of 102,  respiratory rate 20, pulse oximetry 99%, and temperature of  98 degrees Fahrenheit.  CONSTITUTIONAL: Moderately obese, in mild respiratory  distress.  HENT: Normocephalic, atraumatic. Throat is clear. Uvula  midline. Tongue midline. Negative for nasal congestion.  EYES: Pupils are equal and reactive to light. Negative for  icterus or pallor. Full EOM.  NECK: Supple. No increased JVD. No bruit. No  lymphadenopathy. Trachea midline. Negative for thyroid  enlargement and thyroid mass.  CARDIAC: Regular rate and rhythm with no murmur, rub, or  gallop. PMI nondisplaced.  LUNGS: Positive for wheezing bilaterally. Negative for  rhonchi or rales.  ABDOMEN: Obese, soft. Negative for hepatosplenomegaly.  Bowel sounds are present. Negative for tenderness,  guarding, or rebound.  EXTREMITIES: No calf tenderness. No edema. Good range  of motion of joints.  NEUROLOGIC: Alert, oriented x3. Cranial nerves 2-12  intact. Strength is 5/5 upper and lower extremities.  SKIN: Dry with good turgor.    LABORATORY DATA: WBC 6800, hemoglobin 10,  hematocrit 34, and platelets of 243,000. Sodium 142,  potassium 3.8, chloride 105, CO2 26, BUN 8, creatinine  0.85, and blood sugar 117. BNP 36, and troponin of less  than 0.02.    Chest x-ray is negative for infiltrates.    ADMITTING DIAGNOSIS AND PLAN   1. Exacerbation of chronic obstructive pulmonary disease  with asthma. The patient will be admitted for observation.  Continue nebulizer treatments q.4 hours. The patient will be  restarted on Symbicort 2 puffs b.i.d. IV Solu-Medrol q.6  hours. Will follow peak flow. The patient will be placed on  O2. The patient has been wheezing for the past  week, despite the fact that she was given steroids and  continued to wheeze. Will start azithromycin p.o. 500 mg  daily.  2. Human immunodeficiency virus infection, as per  emergency room records. The patient is not on any retroviral.  Follow up with her primary care provider.  3. Hypertension. Blood pressure is 147/75. Monitor blood  pressure closely.  4. The patient will be placed on GI and DVT prophylaxis.  All diagnoses were present on admission.    The patient is FULL CODE.        Lamar BlinksHOMAS S. Verlyn Lambert, MD    TSM / LE  D:  05/04/2016   05:58  T:  05/04/2016   08:52  Job #:  [id number]

## 2016-05-05 MED ORDER — EMTRICITABINE-TENOFOVIR 200 MG-300 MG TAB
200-300 mg | ORAL_TABLET | Freq: Every day | ORAL | 0 refills | Status: AC
Start: 2016-05-05 — End: ?

## 2016-05-05 MED ORDER — AZITHROMYCIN 250 MG TAB
250 mg | ORAL_TABLET | Freq: Every day | ORAL | 0 refills | Status: AC
Start: 2016-05-05 — End: 2016-05-08

## 2016-05-05 MED ORDER — PREDNISONE 20 MG TAB
20 mg | ORAL_TABLET | ORAL | 0 refills | Status: AC
Start: 2016-05-05 — End: ?

## 2016-05-05 MED ORDER — IPRATROPIUM 20 MCG-ALBUTEROL 100 MCG/ACTUATION MIST FOR INHALATION
20-100 mcg/actuation | Freq: Four times a day (QID) | RESPIRATORY_TRACT | 0 refills | Status: AC
Start: 2016-05-05 — End: ?

## 2016-05-05 MED FILL — SENNA LAX 8.6 MG TABLET: 8.6 mg | ORAL | Qty: 1

## 2016-05-05 MED FILL — BUDESONIDE 0.25 MG/2 ML NEB SUSPENSION: 0.25 mg/2 mL | RESPIRATORY_TRACT | Qty: 1

## 2016-05-05 MED FILL — AZITHROMYCIN 250 MG TAB: 250 mg | ORAL | Qty: 2

## 2016-05-05 MED FILL — TRAMADOL 50 MG TAB: 50 mg | ORAL | Qty: 1

## 2016-05-05 MED FILL — LISINOPRIL 5 MG TAB: 5 mg | ORAL | Qty: 1

## 2016-05-05 MED FILL — SOLU-MEDROL (PF) 40 MG/ML SOLUTION FOR INJECTION: 40 mg/mL | INTRAMUSCULAR | Qty: 1

## 2016-05-05 MED FILL — SODIUM CHLORIDE 0.9 % IJ SYRG: INTRAMUSCULAR | Qty: 10

## 2016-05-05 MED FILL — NICOTINE 7 MG/24 HR DAILY PATCH: 7 mg/24 hr | TRANSDERMAL | Qty: 1

## 2016-05-05 MED FILL — LYRICA 50 MG CAPSULE: 50 mg | ORAL | Qty: 1

## 2016-05-05 MED FILL — PANTOPRAZOLE 40 MG TAB, DELAYED RELEASE: 40 mg | ORAL | Qty: 1

## 2016-05-05 MED FILL — MONTELUKAST 10 MG TAB: 10 mg | ORAL | Qty: 1

## 2016-05-05 MED FILL — ALBUTEROL SULFATE 0.083 % (0.83 MG/ML) SOLN FOR INHALATION: 2.5 mg /3 mL (0.083 %) | RESPIRATORY_TRACT | Qty: 1

## 2016-05-05 MED FILL — ENOXAPARIN 40 MG/0.4 ML SUB-Q SYRINGE: 40 mg/0.4 mL | SUBCUTANEOUS | Qty: 0.4

## 2016-05-05 MED FILL — TRUVADA 200 MG-300 MG TABLET: 200-300 mg | ORAL | Qty: 1

## 2016-05-05 NOTE — Discharge Summary (Signed)
[facility]  DISCHARGE SUMMARY    Name:  Sarah HighmanBURTON, Sarah  MR#:  [id number]  DOB:  1961-03-06  Account #:  [id number]  Date of Adm:  05/04/2016  Date of Discharge:      ADMISSION DATE: 05/04/2016    DISCHARGE DATE: 05/05/2016    DIAGNOSES AT DISCHARGE  1. Hypoxia secondary to chronic obstructive pulmonary  disease exacerbation.  2. Acute on chronic obstructive pulmonary disease  exacerbation.  3. Transient hypotension.  4. Human immunodeficiency virus disease.  5. Bipolar disorder.  6. History of hypertension.  7. Obesity.  The above diagnoses were present at the time of admission.    HOSPITAL COURSE: The patient is a pleasant 55 year old  African-American female smoker who presented  with hypoxia. The patient was admitted. She was treated for  COPD exacerbation with azithromycin, Solu-Medrol  nebulizer treatments, supplemental oxygen. Her blood  pressure medication was held for 1 day because of low  blood pressure. Her blood pressure is improving. She is  feeling fine. Today, she is on room air, ambulating without  difficulty. She has shown significant clinical improvement with  medical management. She is asking to go home. She was  checked with an ambulatory pulse oximetry and it was about  96%. The remainder of her hospital course was  unremarkable.    LABS AND STUDIES: WBC 6.8, hemoglobin 10.9,  hematocrit 34.8, platelets 243. Sodium 142, potassium 3.8,  chloride 105, glucose 117, BUN 8, creatinine 0.85, calcium  8.9. BNP 36. Chest x-ray showed no acute pathology.    PLAN: Discharge the patient home today.    DIET: Regular.    ACTIVITY: As tolerated. She should avoid tobacco, alcohol  and illicit drugs.    FOLLOWUP: She says that she has a primary care  physician and she should make an appointment to see that  physician in the next 1 week. If she cannot see that  physician, she should [facility]    MEDICATIONS AT TIME OF DISCHARGE  (NEW  MEDICINES)  1. Prednisone 40 mg p.o. b.i.d. for 2 days, then 40 mg p.o.  daily for 2 days, then 20 mg p.o. daily for 2 days, then stop.  2. Combivent inhaler 1 puff every 6 hours as needed.  3. Azithromycin 250 mg p.o. daily for an additional 3 days.    She should resume the following medicines  1. Truvada 200/300 mg tablet p.o. daily.  2. Hydroxyzine 50 mg p.o. b.i.d.  3. Lisinopril 5 mg p.o. daily.  4. Singulair 10 mg p.o. daily.  5. Symbicort 160/4.5 mg HFA 2 puffs b.i.d.  6. Ultram 50 mg p.o. t.i.d.    Total time for discharge process, greater than 33 minutes.        Gustavus MessingSTEVEN Moussa Wiegand, MD    SH / KMS  D:  05/05/2016   13:36  T:  05/05/2016   18:29  Job #:  [id number]    [facility]

## 2016-05-05 NOTE — Discharge Summary (Signed)
Discharge Summary by Mancel BaleHamlette, Angelin Cutrone H, MD at 05/05/16 1302                Author: Mancel BaleHamlette, Kyden Potash H, MD  Service: Internal Medicine  Author Type: Physician       Filed: 05/05/16 1337  Date of Service: 05/05/16 1302  Status: Signed          Editor: Mancel BaleHamlette, Lennyx Verdell H, MD (Physician)                          [facility]ist Brief DISCHARGE NOTE:      She is feeling much better. On room air this morning. Ambulating without difficulty.   She is asking to go home.      Patient interviewed and examined.   Vitals, meds, labs reviewed.      CXR :IMPRESSION:  No evidence of active disease. Possible chronic lung disease.         Patient medically stable enough for discharge.         DIAGNOSIS AT DISCHARGE:      1.  Hypoxia   2.  COPD exacerbation   3.  Hypotension   4.  HIV disease   5.  Bipolar disorder   6.  H/o HTN   7.  obesity      THE ABOVE DIAGNOSES WERE POA      PLEASE SEE JOB ID # [id number]  FOR COMPLETE DISCHARGE SUMMARY      Mancel BaleSteven H Dazaria Macneill, MD   05/05/2016   1:02 PM

## 2016-05-05 NOTE — Other (Signed)
05/05/2016      GLOS     Discharge Date today    RRAT score 27    Plan of care PT recommends home. Ambulatory pulse ox completed, pt does not require home oxygen. Nebs, steroids, PO zithromax.           Sheral FlowVictoria Howell [facility] HospitalBSN,RN

## 2016-05-05 NOTE — Progress Notes (Signed)
Care Transitions: Pt for discharge today consented to care transitions, medications prescriptions to be sent to listed pharmacy will continue to follow

## 2016-05-05 NOTE — Discharge Summary (Signed)
[facility] [facility]ist Brief DISCHARGE NOTE:    She is feeling much better. On room air this morning. Ambulating without difficulty.  She is asking to go home.    Patient interviewed and examined.  Vitals, meds, labs reviewed.    CXR :IMPRESSION:  No evidence of active disease. Possible chronic lung disease.      Patient medically stable enough for discharge.      DIAGNOSIS AT DISCHARGE:    1. Hypoxia  2. COPD exacerbation  3. Hypotension  4. HIV disease  5. Bipolar disorder  6. H/o HTN  7. obesity    THE ABOVE DIAGNOSES WERE POA    PLEASE SEE JOB ID # [id number]  FOR COMPLETE DISCHARGE SUMMARY    Mancel BaleSteven H Liela Rylee, MD  05/05/2016  1:02 PM

## 2016-05-05 NOTE — Other (Signed)
Bedside and Written shift change report given to [place] (oncoming nurse) by cmiller (offgoing nurse). Report included the following information SBAR, Kardex, MAR and Recent Results.

## 2016-05-05 NOTE — Progress Notes (Signed)
Pt stable in no resp distress. Breathing unlabored, lungs mild wheezing, RR on RA. Ambulated as tolerated to bathroom with no concern. Denied pain or discomfort. Tolerated po intake well. Appetite good. Remained on solumedrol and neb txmt per order and tolerated. Pt discharge home with all belongings. Discharge instructions read and given and understanding verbalized. Pt will call cab while in lobby. PIV line removed. Transportation paged for transport via w/c.

## 2016-05-05 NOTE — Progress Notes (Signed)
Physical Therapy Initial Evaluation:    Orders reviewed, chart reviewed, and initial evaluation completed on Sarah Anderson.    Chief complaint: SOB/wheezing  Date of onset: yesterday  Mechanism of injury: exacerbated COPD  Pre-Morbid functionality: used quad cane, lived with boyfriend, no steps indoor but 6 to get to parking lot  Previous Therapies:unknown  Occupation: disabled    Patient presents at an overall level of assistance of modified independent with basic functional mobility. Results of the evaluation consist of:   Gross Assessment AROM: Generally decreased, functional, Strength: Generally decreased, functional (4 to 4+/5 x 4)   Pain Screen Pain Scale 1: Numeric (0 - 10), Pain Intensity 1: 0, Patient Stated Pain Goal: 0   Gait Speed/Cadence: Slow, Ambulation - Level of Assistance: Modified independent, Distance (ft): 200 Feet (ft), Assistive Device: Cane, quad, Stairs - Level of Assistance: Modified independent, Number of Stairs Trained: 9     Romberg test passed, functional reach WNL, sit-stand w/o UE support repeated without difficulty or LO balance.  Bed Mobility Rolling:  (independent all bed mobility ans transfers)    O2 saturation was monitored, 97% before activity, only dropping to 96% after stairs.w/o supplement.  Patient Response Patient Response: tolerated well  Based on this evaluation, the patient has no further skilled PT-related needs. She may return to previous setting after discharge.    Goals: none    Treatment Plan: D/C PT    Thanks for the consult.  Therapist: Arturo Mortonhomas D. Jarrett SohoVaeth, PT     11:25-11:50 a.m.

## 2016-05-05 NOTE — Progress Notes (Signed)
Pt's SpO2 is 92%- 94% on room air.  Bilateral expiratory wheezing noted.  No respiratory distress noted.  Neb treatment given.

## 2016-05-05 NOTE — Progress Notes (Signed)
Care Transition Note-Per Secretary patient called and schedule her appointment with Dr. Doreene BurkeJames Tasidner @ [address] on 05-11-2016@ 2:00PM.Discharge Summary can be fax to [phone].  Gwinda MaineLinda Ansel Ferrall- Care Transition Liaison.

## 2016-05-05 NOTE — Discharge Summary (Signed)
[facility]  DISCHARGE SUMMARY    Name:  Sarah Anderson  MR#:  [id number]  DOB:  May 04, 1961  Account #:  [id number]  Date of Adm:  05/04/2016  Date of Discharge:      ADMISSION DATE: 05/04/2016    DISCHARGE DATE: 05/05/2016    DIAGNOSES AT DISCHARGE  1. Hypoxia secondary to chronic obstructive pulmonary  disease exacerbation.  2. Acute on chronic obstructive pulmonary disease  exacerbation.  3. Transient hypotension.  4. Human immunodeficiency virus disease.  5. Bipolar disorder.  6. History of hypertension.  7. Obesity.  The above diagnoses were present at the time of admission.    HOSPITAL COURSE: The patient is a pleasant 55 year old  African-American female smoker who presented  with hypoxia. The patient was admitted. She was treated for  COPD exacerbation with azithromycin, Solu-Medrol  nebulizer treatments, supplemental oxygen. Her blood  pressure medication was held for 1 day because of low  blood pressure. Her blood pressure is improving. She is  feeling fine. Today, she is on room air, ambulating without  difficulty. She has shown significant clinical improvement with  medical management. She is asking to go home. She was  checked with an ambulatory pulse oximetry and it was about  96%. The remainder of her hospital course was  unremarkable.    LABS AND STUDIES: WBC 6.8, hemoglobin 10.9,  hematocrit 34.8, platelets 243. Sodium 142, potassium 3.8,  chloride 105, glucose 117, BUN 8, creatinine 0.85, calcium  8.9. BNP 36. Chest x-ray showed no acute pathology.    PLAN: Discharge the patient home today.    DIET: Regular.    ACTIVITY: As tolerated. She should avoid tobacco, alcohol  and illicit drugs.    FOLLOWUP: She says that she has a primary care  physician and she should make an appointment to see that  physician in the next 1 week. If she cannot see that  physician, she should [facility] the providers [facility].    MEDICATIONS AT TIME OF DISCHARGE (NEW   MEDICINES)  1. Prednisone 40 mg p.o. b.i.d. for 2 days, then 40 mg p.o.  daily for 2 days, then 20 mg p.o. daily for 2 days, then stop.  2. Combivent inhaler 1 puff every 6 hours as needed.  3. Azithromycin 250 mg p.o. daily for an additional 3 days.    She should resume the following medicines  1. Truvada 200/300 mg tablet p.o. daily.  2. Hydroxyzine 50 mg p.o. b.i.d.  3. Lisinopril 5 mg p.o. daily.  4. Singulair 10 mg p.o. daily.  5. Symbicort 160/4.5 mg HFA 2 puffs b.i.d.  6. Ultram 50 mg p.o. t.i.d.    Total time for discharge process, greater than 33 minutes.        Gustavus MessingSTEVEN Taeshaun Rames, MD    SH / KMS  D:  05/05/2016   13:36  T:  05/05/2016   18:29  Job #:  [id number]    [facility]

## 2016-05-06 NOTE — Telephone Encounter (Signed)
Care Management Follow-Up Care Call    Name: Weston SettleHannah L Tata  MRN: [id number]  DOB:November 30, 1960  Phone#: [phone] (home)   Admit Date: (Not on file)  Discharge Date:   Discharge Disposition: Home    Were you given written instructions before you left the hospital? Yes     Were you given prescriptions before you left?  Yes    Were you able to fill your prescriptions?  Yes     If so which pharmacy did you use? [facility]   (The number to call for pharmacy consultation is extension [phone].)    Do you have a follow-up appointment scheduled with your primary care doctor?   Yes     If so, do you know the date of the appointment?  Yes    Appointment date/time?  05/11/2016 at 2:00PM    [address] or private physician's offfice?  DR.TANSINDER(Private)  .   If no, what kept you from scheduling a follow-up appointment with your primary  care doctor?      Was home health or other services offered when you were discharged?  Yes    Were you contacted by the agency yet?   Yes    What would have made your discharge experience better?  No service was great    Summarize comments:     Ms. Azucena CecilBurton thank you for allowing us to serve you, and have a wonderful day    You will be receiving a survey, please respond to the survey.

## 2016-08-17 ENCOUNTER — Emergency Department (HOSPITAL_COMMUNITY): Payer: Medicare Other

## 2016-08-17 ENCOUNTER — Encounter (HOSPITAL_COMMUNITY): Payer: Self-pay

## 2016-08-17 ENCOUNTER — Emergency Department (HOSPITAL_COMMUNITY)
Admission: EM | Admit: 2016-08-17 | Discharge: 2016-08-17 | Disposition: A | Discharge status: Home / Self Care | Payer: Medicare Other | Attending: Emergency Medicine | Admitting: Emergency Medicine

## 2016-08-17 ENCOUNTER — Telehealth: Payer: Self-pay | Admitting: *Deleted

## 2016-08-17 DIAGNOSIS — J45909 Unspecified asthma, uncomplicated: Secondary | ICD-10-CM | POA: Insufficient documentation

## 2016-08-17 DIAGNOSIS — J441 Chronic obstructive pulmonary disease with (acute) exacerbation: Secondary | ICD-10-CM

## 2016-08-17 DIAGNOSIS — Z21 Asymptomatic human immunodeficiency virus [HIV] infection status: Secondary | ICD-10-CM | POA: Insufficient documentation

## 2016-08-17 DIAGNOSIS — R0602 Shortness of breath: Secondary | ICD-10-CM | POA: Diagnosis present

## 2016-08-17 DIAGNOSIS — J449 Chronic obstructive pulmonary disease, unspecified: Secondary | ICD-10-CM | POA: Diagnosis not present

## 2016-08-17 DIAGNOSIS — I509 Heart failure, unspecified: Secondary | ICD-10-CM | POA: Insufficient documentation

## 2016-08-17 DIAGNOSIS — Z79899 Other long term (current) drug therapy: Secondary | ICD-10-CM | POA: Diagnosis not present

## 2016-08-17 HISTORY — DX: Heart failure, unspecified: I50.9

## 2016-08-17 HISTORY — DX: Bipolar disorder, unspecified: F31.9

## 2016-08-17 HISTORY — DX: Polyneuropathy, unspecified: G62.9

## 2016-08-17 HISTORY — DX: Schizophrenia, unspecified: F20.9

## 2016-08-17 HISTORY — DX: Zoster without complications: B02.9

## 2016-08-17 HISTORY — DX: Chronic obstructive pulmonary disease, unspecified: J44.9

## 2016-08-17 HISTORY — DX: Unspecified asthma, uncomplicated: J45.909

## 2016-08-17 HISTORY — DX: Human immunodeficiency virus (HIV) disease: B20

## 2016-08-17 LAB — BASIC METABOLIC PANEL
Anion gap: 11 (ref 5–15)
BUN: 12 mg/dL (ref 6–20)
CHLORIDE: 105 mmol/L (ref 101–111)
CO2: 24 mmol/L (ref 22–32)
Calcium: 8.8 mg/dL — ABNORMAL LOW (ref 8.9–10.3)
Creatinine, Ser: 0.71 mg/dL (ref 0.44–1.00)
GFR calc Af Amer: >60 mL/min (ref >60)
GFR calc non Af Amer: >60 mL/min (ref >60)
GLUCOSE: 120 mg/dL — AB (ref 65–99)
POTASSIUM: 3.4 mmol/L — AB (ref 3.5–5.1)
SODIUM: 140 mmol/L (ref 135–145)

## 2016-08-17 LAB — CBC WITH DIFFERENTIAL/PLATELET
Basophils Absolute: 0 10*3/uL (ref 0.0–0.1)
Basophils Relative: 0 %
Eosinophils Absolute: 0.1 10*3/uL (ref 0.0–0.7)
Eosinophils Relative: 1 %
HCT: 33 % — ABNORMAL LOW (ref 36.0–46.0)
HEMOGLOBIN: 10.2 g/dL — AB (ref 12.0–15.0)
LYMPHS ABS: 2.5 10*3/uL (ref 0.7–4.0)
LYMPHS PCT: 32 %
MCH: 24.2 pg — AB (ref 26.0–34.0)
MCHC: 30.9 g/dL (ref 30.0–36.0)
MCV: 78.4 fL (ref 78.0–100.0)
Monocytes Absolute: 0.9 10*3/uL (ref 0.1–1.0)
Monocytes Relative: 12 %
NEUTROS ABS: 4.3 10*3/uL (ref 1.7–7.7)
NEUTROS PCT: 55 %
Platelets: 262 10*3/uL (ref 150–400)
RBC: 4.21 MIL/uL (ref 3.87–5.11)
RDW: 14.9 % (ref 11.5–15.5)
WBC: 7.9 10*3/uL (ref 4.0–10.5)

## 2016-08-17 MED ORDER — FUROSEMIDE 40 MG PO TABS
40.0000 mg | ORAL_TABLET | Freq: Once | ORAL | Status: AC
  Administered 2016-08-17: 40 mg via ORAL
  Filled 2016-08-17: qty 1

## 2016-08-17 MED ORDER — SYMBICORT 160-4.5 MCG/ACT IN AERO: 2.0000 | INHALATION_SPRAY | Freq: Two times a day (BID) | RESPIRATORY_TRACT | 0 refills | Status: AC

## 2016-08-17 MED ORDER — PREDNISONE 20 MG PO TABS
60.0000 mg | ORAL_TABLET | Freq: Once | ORAL | Status: AC
  Administered 2016-08-17: 60 mg via ORAL
  Filled 2016-08-17: qty 3

## 2016-08-17 MED ORDER — ALBUTEROL SULFATE HFA 108 (90 BASE) MCG/ACT IN AERS
2.0000 | INHALATION_SPRAY | Freq: Once | RESPIRATORY_TRACT | Status: AC
  Administered 2016-08-17: 2 via RESPIRATORY_TRACT
  Filled 2016-08-17: qty 6.7

## 2016-08-17 MED ORDER — AZITHROMYCIN 250 MG PO TABS: 250.0000 mg | ORAL_TABLET | Freq: Every day | ORAL | 0 refills | Status: AC

## 2016-08-17 MED ORDER — AZITHROMYCIN 250 MG PO TABS
500.0000 mg | ORAL_TABLET | Freq: Once | ORAL | Status: AC
  Administered 2016-08-17: 500 mg via ORAL
  Filled 2016-08-17: qty 2

## 2016-08-17 MED ORDER — IPRATROPIUM-ALBUTEROL 0.5-2.5 (3) MG/3ML IN SOLN
3.0000 mL | RESPIRATORY_TRACT | Status: AC
  Administered 2016-08-17 (×3): 3 mL via RESPIRATORY_TRACT
  Filled 2016-08-17: qty 3
  Filled 2016-08-17: qty 6

## 2016-08-17 MED ORDER — ALBUTEROL SULFATE (2.5 MG/3ML) 0.083% IN NEBU
5.0000 mg | INHALATION_SOLUTION | Freq: Once | RESPIRATORY_TRACT | Status: AC
  Administered 2016-08-17: 5 mg via RESPIRATORY_TRACT
  Filled 2016-08-17: qty 6

## 2016-08-17 MED ORDER — PREDNISONE 10 MG PO TABS: 40.0000 mg | ORAL_TABLET | Freq: Every day | ORAL | 0 refills | Status: AC

## 2016-08-17 NOTE — Telephone Encounter (Signed)
Pharmacy called to verify pt received 500 mg azithromycin (ZITHROMAX) tablets in ER prior to discharge because she was prescribed 4 tablets.  EDCM reviewed chart to find that pt had indeed been given 500 mg in ER.

## 2016-08-17 NOTE — ED Provider Notes (Signed)
[facility]-EMERGENCY DEPT Provider Note   CSN: [id number] Arrival date & time: 08/17/16  3086     History   Chief Complaint Chief Complaint  Patient presents with  . Shortness of Breath  . Cough    HPI Dorothy Williams is a 55 y.o. female.  The history is provided by the patient.  Shortness of Breath  This is a recurrent problem. The average episode lasts 1 week. The problem has been gradually worsening. Associated symptoms include a fever (subjective), rhinorrhea, cough, sputum production and wheezing. Pertinent negatives include no sore throat, no ear pain, no chest pain, no vomiting, no abdominal pain and no rash. She has tried beta-agonist inhalers and inhaled steroids for the symptoms. Associated medical issues include asthma, COPD, pneumonia and heart failure. Associated medical issues do not include PE.  Cough  Associated symptoms include chills, rhinorrhea, shortness of breath and wheezing. Pertinent negatives include no chest pain, no ear pain and no sore throat. Her past medical history is significant for pneumonia, COPD and asthma.   She is here visiting. Last CD4 approx 700 in Aug at [place] in [place]. Reports she has been off of Lasix for 2 weeks since she has been in town. She also ran out of her inhalers this morning.  Past Medical History:  Diagnosis Date  . Asthma   . Bipolar disorder (HCC)   . CHF (congestive heart failure) (HCC)   . COPD (chronic obstructive pulmonary disease) (HCC)   . HIV (human immunodeficiency virus infection) (HCC)   . Manic depression (HCC)   . Neuropathy (HCC)   . Schizophrenia (HCC)   . Shingles     There are no active problems to display for this patient.   Past Surgical History:  Procedure Laterality Date  . ABDOMINAL HYSTERECTOMY    . BRONCHOSCOPY    . BUNIONECTOMY      OB History    No data available       Home Medications    Prior to Admission medications   Medication Sig Start Date End Date Taking? Authorizing  Provider  acyclovir (ZOVIRAX) 800 MG tablet Take 800 mg by mouth 2 (two) times daily as needed for other. Cold sores 07/30/16  Yes Historical Provider, MD  ARIPiprazole (ABILIFY) 20 MG tablet Take 20 mg by mouth daily.    Yes Historical Provider, MD  COMBIVENT RESPIMAT 20-100 MCG/ACT AERS respimat Inhale 2 puffs into the lungs every 6 (six) hours as needed for shortness of breath or wheezing. 06/26/16  Yes Historical Provider, MD  dolutegravir (TIVICAY) 50 MG tablet Take 50 mg by mouth daily.   Yes Historical Provider, MD  emtricitabine-tenofovir (TRUVADA) 200-300 MG tablet Take 1 tablet by mouth daily.   Yes Historical Provider, MD  FLUARIX QUADRIVALENT 0.5 ML injection Inject 0.5 mLs into the skin once. 08/04/16  Yes Historical Provider, MD  LYRICA 300 MG capsule Take 300 mg by mouth 2 (two) times daily. 07/16/16  Yes Historical Provider, MD  polyethylene glycol powder (GLYCOLAX/MIRALAX) powder Take 17 g by mouth 2 (two) times daily as needed for constipation. 07/25/16  Yes Historical Provider, MD  sulfamethoxazole-trimethoprim (BACTRIM DS,SEPTRA DS) 800-160 MG tablet Take 1 tablet by mouth daily.   Yes Historical Provider, MD  terconazole (TERAZOL 7) 0.4 % vaginal cream Place 1 applicator vaginally daily as needed for irritation. 07/30/16  Yes Historical Provider, MD  traMADol (ULTRAM) 50 MG tablet Take 50 mg by mouth 3 (three) times daily as needed for pain. 06/19/16  Yes Historical Provider,  MD  VENTOLIN HFA 108 (90 Base) MCG/ACT inhaler Inhale 2 puffs into the lungs every 4 (four) hours as needed for shortness of breath or wheezing. 06/18/16  Yes Historical Provider, MD  zolpidem (AMBIEN) 10 MG tablet Take 10 mg by mouth at bedtime as needed for sleep.   Yes Historical Provider, MD  azithromycin (ZITHROMAX) 250 MG tablet Take 1 tablet (250 mg total) by mouth daily. Take first 2 tablets together, then 1 every day until finished. 08/18/16 08/22/16  Nira ConnPedro Eduardo Cardama, MD  predniSONE (DELTASONE) 10 MG  tablet Take 4 tablets (40 mg total) by mouth daily. 08/18/16 08/22/16  Nira ConnPedro Eduardo Cardama, MD  SYMBICORT 160-4.5 MCG/ACT inhaler Inhale 2 puffs into the lungs 2 (two) times daily. 08/17/16   Nira ConnPedro Eduardo Cardama, MD    Family History History reviewed. No pertinent family history.  Social History Social History  Substance Use Topics  . Smoking status: Never Smoker  . Smokeless tobacco: Never Used  . Alcohol use No     Allergies   Penicillins and Other   Review of Systems Review of Systems  Constitutional: Positive for chills and fever (subjective).  HENT: Positive for rhinorrhea. Negative for ear pain and sore throat.   Eyes: Negative for pain and visual disturbance.  Respiratory: Positive for cough, sputum production, shortness of breath and wheezing.   Cardiovascular: Negative for chest pain and palpitations.  Gastrointestinal: Positive for abdominal distention. Negative for abdominal pain and vomiting.  Genitourinary: Negative for dysuria and hematuria.  Musculoskeletal: Negative for arthralgias and back pain.  Skin: Negative for color change and rash.  Neurological: Negative for seizures and syncope.  All other systems reviewed and are negative.    Physical Exam Updated Vital Signs BP 111/69 (BP Location: Right Arm)   Pulse 84   Temp 98 F (36.7 C) (Oral)   Resp 15   SpO2 95%   Physical Exam  Constitutional: She is oriented to person, place, and time. She appears well-developed and well-nourished. No distress.  HENT:  Head: Normocephalic and atraumatic.  Nose: Nose normal.  Eyes: Conjunctivae and EOM are normal. Pupils are equal, round, and reactive to light. Right eye exhibits no discharge. Left eye exhibits no discharge. No scleral icterus.  Neck: Normal range of motion. Neck supple. No JVD present.  Cardiovascular: Normal rate and regular rhythm.  Exam reveals no gallop and no friction rub.   No murmur heard. Pulmonary/Chest: No stridor. Tachypnea  noted. No respiratory distress. She has wheezes (diffuse). She has no rales.  Abdominal: Soft. She exhibits distension. There is no tenderness.  Musculoskeletal: She exhibits no edema or tenderness.  Neurological: She is alert and oriented to person, place, and time.  Skin: Skin is warm and dry. No rash noted. She is not diaphoretic. No erythema.  Psychiatric: She has a normal mood and affect.  Vitals reviewed.    ED Treatments / Results  Labs (all labs ordered are listed, but only abnormal results are displayed) Labs Reviewed  CBC WITH DIFFERENTIAL/PLATELET - Abnormal; Notable for the following:       Result Value   Hemoglobin 10.2 (*)    HCT 33.0 (*)    MCH 24.2 (*)    All other components within normal limits  BASIC METABOLIC PANEL - Abnormal; Notable for the following:    Potassium 3.4 (*)    Glucose, Bld 120 (*)    Calcium 8.8 (*)    All other components within normal limits    EKG  EKG  Interpretation  Date/Time:  Tuesday August 17 2016 09:28:04 EDT Ventricular Rate:  80 PR Interval:    QRS Duration: 81 QT Interval:  395 QTC Calculation: 456 R Axis:   48 Text Interpretation:  Sinus rhythm Probable anteroseptal infarct, old No old tracing to compare Confirmed by [facility] MD, PEDRO ([id number]) on 08/17/2016 9:59:24 AM       Radiology Dg Chest 2 View  Result Date: 08/17/2016 CLINICAL DATA:  Shortness of breath, cough, congestion. Central chest pain. EXAM: CHEST  2 VIEW COMPARISON:  None. FINDINGS: There is hyperinflation of the lungs compatible with COPD. Heart and mediastinal contours are within normal limits. No focal opacities or effusions. No acute bony abnormality. IMPRESSION: COPD.  No active disease. Electronically Signed   By: Charlett Nose M.D.   On: 08/17/2016 10:02    Procedures Procedures (including critical care time)  Medications Ordered in ED Medications  albuterol (PROVENTIL HFA;VENTOLIN HFA) 108 (90 Base) MCG/ACT inhaler 2 puff (not administered)    albuterol (PROVENTIL) (2.5 MG/3ML) 0.083% nebulizer solution 5 mg (5 mg Nebulization Given 08/17/16 0928)  predniSONE (DELTASONE) tablet 60 mg (60 mg Oral Given 08/17/16 1037)  ipratropium-albuterol (DUONEB) 0.5-2.5 (3) MG/3ML nebulizer solution 3 mL (3 mLs Nebulization Given 08/17/16 1111)  azithromycin (ZITHROMAX) tablet 500 mg (500 mg Oral Given 08/17/16 1039)  furosemide (LASIX) tablet 40 mg (40 mg Oral Given 08/17/16 1039)     Initial Impression / Assessment and Plan / ED Course  I have reviewed the triage vital signs and the nursing notes.  Pertinent labs & imaging results that were available during my care of the patient were reviewed by me and considered in my medical decision making (see chart for details).  Clinical Course    COPD exacerbation. Chest x-ray without evidence of pneumonia or PCP. Per patient's CD4 count stable. CBC without leukopenia. Significant improvement with breathing treatments and steroids. Patient given azithromycin.  Low suspicion for pulmonary embolism. Low suspicion for ACS.  Safe for discharge with strict return precautions.  Final Clinical Impressions(s) / ED Diagnoses   Final diagnoses:  COPD exacerbation (HCC)   Disposition: Discharge  Condition: Good  I have discussed the results, Dx and Tx plan with the patient who expressed understanding and agree(s) with the plan. Discharge instructions discussed at great length. The patient was given strict return precautions who verbalized understanding of the instructions. No further questions at time of discharge.    New Prescriptions   AZITHROMYCIN (ZITHROMAX) 250 MG TABLET    Take 1 tablet (250 mg total) by mouth daily. Take first 2 tablets together, then 1 every day until finished.   PREDNISONE (DELTASONE) 10 MG TABLET    Take 4 tablets (40 mg total) by mouth daily.    Follow Up: Primary care provider  Schedule an appointment as soon as possible for a visit        Nira Conn, MD 08/17/16 1245

## 2016-08-17 NOTE — ED Notes (Signed)
Respiratory notified of nebulization treatment. 

## 2016-08-17 NOTE — ED Triage Notes (Signed)
Pt c/o SOB, productive cough, and headache x 2 days.  Pain score 9/10.  Pt reports using inhaler and taking ibuprofen w/o relief.  Hx of COPD.  Pt reports running out of her inhaler this morning.

## 2016-08-17 NOTE — ED Notes (Signed)
Respiratory notified of nebulization. 

## 2016-08-17 NOTE — ED Notes (Signed)
Discharge instructions, follow up care,  And rx x3 reviewed with patient. Patient verbalized understanding.

## 2017-04-09 IMAGING — CR DG CHEST 2V
3 series · 3 of 3 positions shown · non-contrast
Comparison: None.

CLINICAL DATA: Shortness of breath, cough, congestion. Central
chest pain.

EXAM:
CHEST  2 VIEW

[w chest lat (1 of 2)]
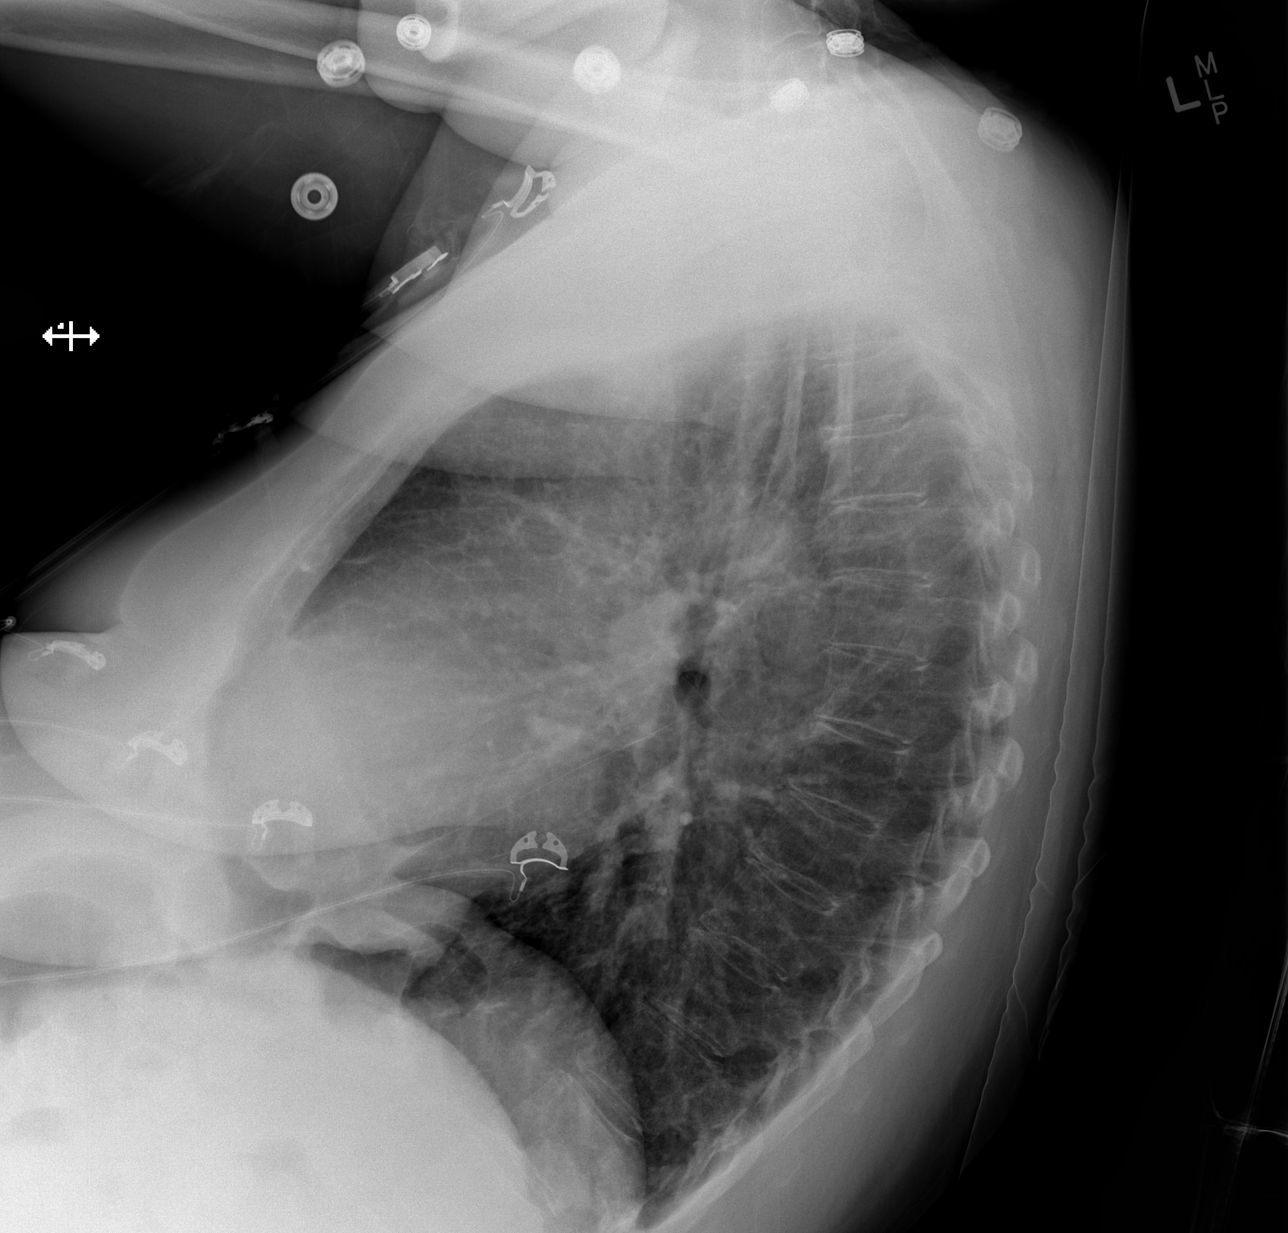

[w chest lat (2 of 2)]
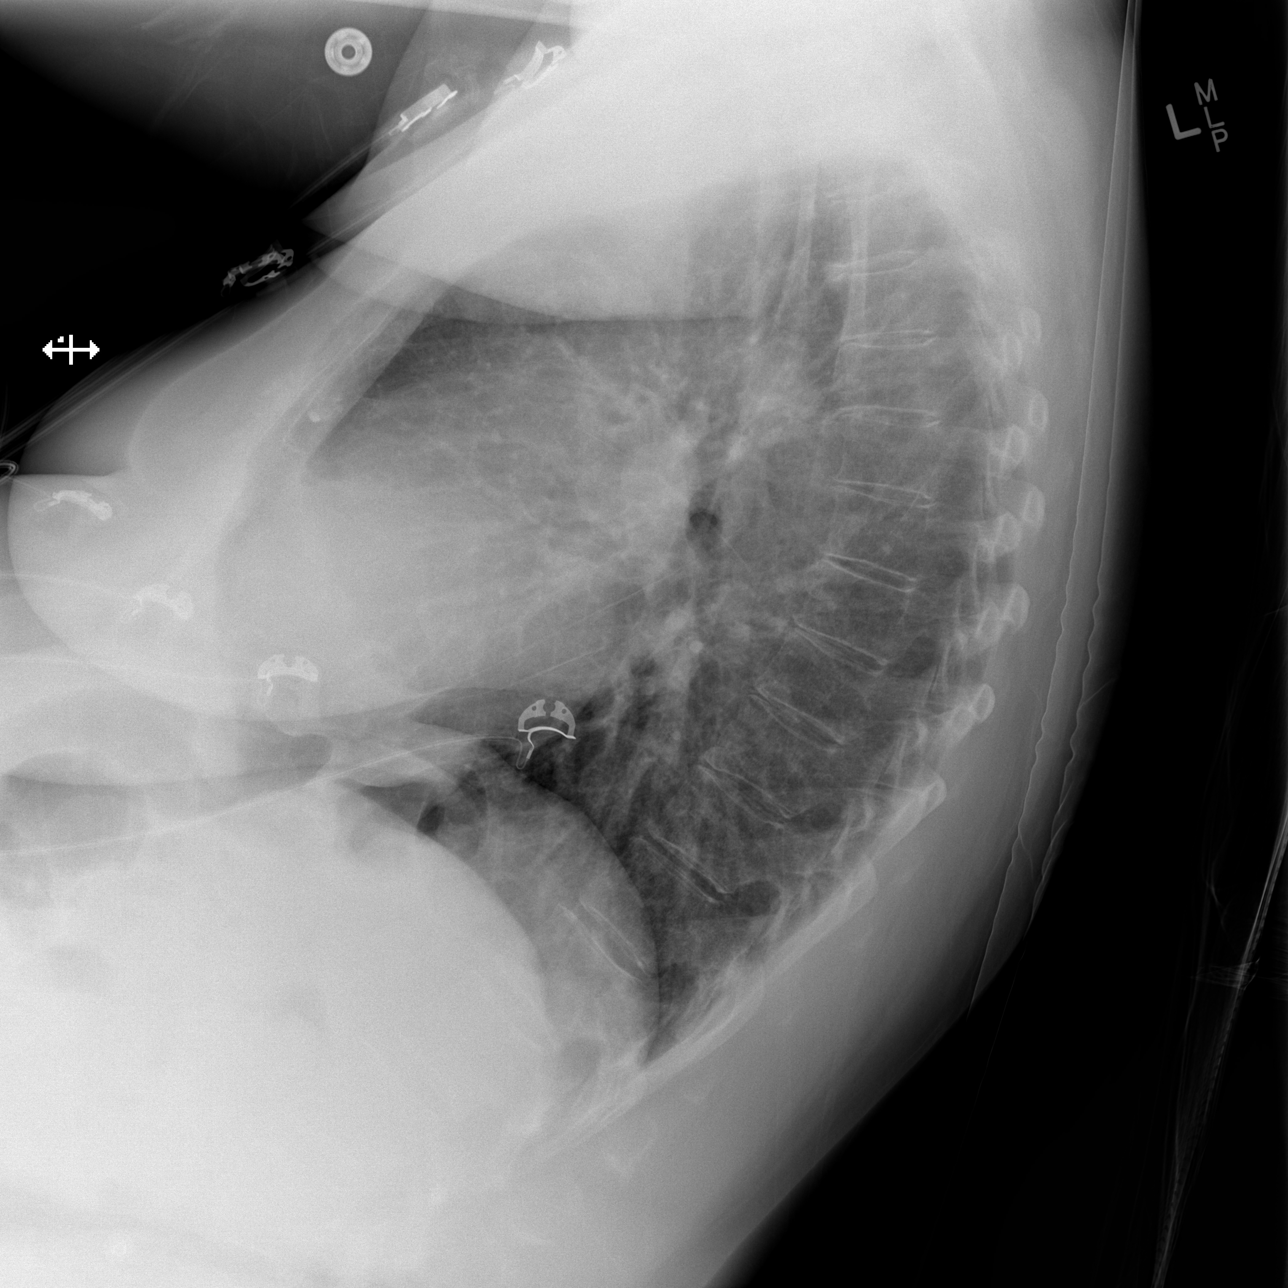

[x chest ap]
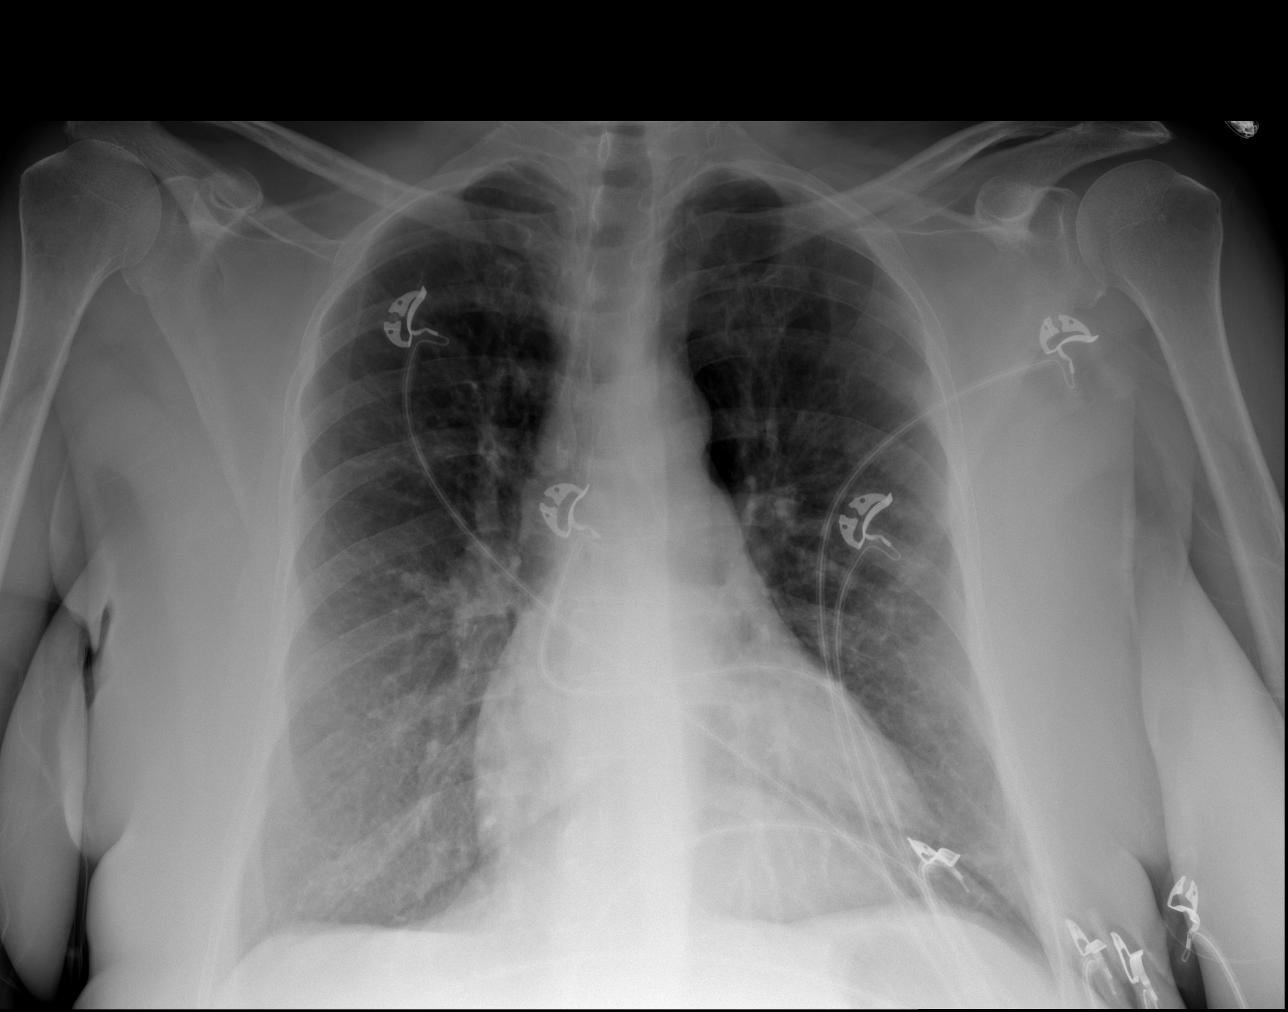

[3 of 3 positions shown; findings below may reference images not displayed]

FINDINGS: There is hyperinflation of the lungs compatible with COPD. Heart and
mediastinal contours are within normal limits. No focal opacities or
effusions. No acute bony abnormality.
IMPRESSION: COPD.  No active disease.
